# Patient Record
Sex: Female | Born: 2005 | Hispanic: No | Marital: Single | Smoking: Never smoker
Health system: Southern US, Community
[De-identification: ages and names within clinical notes are randomized; demographics above are authoritative.]

## PROBLEM LIST (undated history)

## (undated) DIAGNOSIS — R519 Headache, unspecified: Secondary | ICD-10-CM

## (undated) DIAGNOSIS — C753 Malignant neoplasm of pineal gland: Secondary | ICD-10-CM

## (undated) DIAGNOSIS — Z9889 Other specified postprocedural states: Secondary | ICD-10-CM

## (undated) HISTORY — PX: BRAIN SURGERY: SHX531

## (undated) HISTORY — DX: Headache, unspecified: R51.9

## (undated) HISTORY — PX: PORTA CATH INSERTION: CATH118285

## (undated) HISTORY — PX: PORT-A-CATH REMOVAL: SHX5289

## (undated) HISTORY — DX: Other specified postprocedural states: Z98.890

---
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
## 2024-03-13 ENCOUNTER — Encounter: Payer: Self-pay | Admitting: Family Medicine

## 2024-03-13 ENCOUNTER — Ambulatory Visit: Admitting: Family Medicine

## 2024-03-13 VITALS — BP 114/81 | HR 90 | Ht 61.0 in | Wt 140.1 lb

## 2024-03-13 DIAGNOSIS — E063 Autoimmune thyroiditis: Secondary | ICD-10-CM | POA: Diagnosis not present

## 2024-03-13 DIAGNOSIS — N915 Oligomenorrhea, unspecified: Secondary | ICD-10-CM | POA: Insufficient documentation

## 2024-03-13 DIAGNOSIS — N914 Secondary oligomenorrhea: Secondary | ICD-10-CM | POA: Diagnosis not present

## 2024-03-13 NOTE — Progress Notes (Signed)
   Subjective:    Patient ID: Monica Mata is a 18 y.o. female presenting with Menstrual Problem (Polyp found at [facility] and it was removed. )  on 03/13/2024  HPI: H/o brain cancer 2021, rx'd surg, XRT, chemo. On DepoLupron during chemo. Cycle returned after 1 year. Cycles were irregular. Then became irregular. Sometimes 2x/month, sometimes skips a month. Was regular prior to chemo. Had polyp noted on cervix and removed. Cycles are still irregular. Weight has been stable.  Review of Systems  Constitutional:  Negative for chills and fever.  Respiratory:  Negative for shortness of breath.   Cardiovascular:  Negative for chest pain.  Gastrointestinal:  Negative for abdominal pain, nausea and vomiting.  Genitourinary:  Negative for dysuria.  Skin:  Negative for rash.      Objective:    BP 114/81 (BP Location: Left Arm, Patient Position: Sitting, Cuff Size: Normal)   Pulse 90   Ht 5' 1 (1.549 m)   Wt 140 lb 1.9 oz (63.6 kg)   LMP 01/05/2024 (Exact Date)   BMI 26.48 kg/m  Physical Exam Exam conducted with a chaperone present.  Constitutional:      General: She is not in acute distress.    Appearance: She is well-developed.  HENT:     Head: Normocephalic and atraumatic.  Eyes:     General: No scleral icterus. Cardiovascular:     Rate and Rhythm: Normal rate.  Pulmonary:     Effort: Pulmonary effort is normal.  Abdominal:     Palpations: Abdomen is soft.  Musculoskeletal:     Cervical back: Neck supple.  Skin:    General: Skin is warm and dry.  Neurological:     Mental Status: She is alert and oriented to person, place, and time.         Assessment & Plan:   Problem List Items Addressed This Visit       Unprioritized   Hashimoto's thyroiditis - Primary   Likely a cause of oligomenorrhea. To return to Endocrinology.      Oligomenorrhea   Likely function of thyroid, and post chemo effects. Recent FSH and LH are WNL and show that she is no longer menopausal.  Return of cycles is reassuring. Not sure if this will lead to premature ovarian failure.         No follow-ups on file.  [name], MD 03/13/2024 10:08 AM

## 2024-03-13 NOTE — Assessment & Plan Note (Signed)
 Likely function of thyroid, and post chemo effects. Recent FSH and LH are WNL and show that she is no longer menopausal. Return of cycles is reassuring. Not sure if this will lead to premature ovarian failure.

## 2024-03-13 NOTE — Assessment & Plan Note (Signed)
 Likely a cause of oligomenorrhea. To return to Endocrinology.

## 2024-06-11 ENCOUNTER — Telehealth: Payer: Self-pay

## 2024-06-11 NOTE — Telephone Encounter (Signed)
 Attempted to schedule appointment this morning but had to leave a voicemail for the patient to call the office back.

## 2024-06-11 NOTE — Telephone Encounter (Signed)
-----   Message from [facility] sent at 06/11/2024  8:14 AM EST ----- Regarding: RE: Cervical Polyp What about today with [name] at 10:30am? ----- Message ----- From: Monica Mata Sent: 06/10/2024   3:55 PM EST To: [facility] Clinical Subject: Cervical Polyp                                 Patient called in stating that she has a cervical polyp again and it's extremely painful. Would like removal in office with a female provider. Informed patient that we do not have a female provider the rest of this week who does that procedure.   Informed her that it has to be with a gyn MD and the schedule for next week is completely booked. Informed patient that I am unable to [facility] without approval from a provider. Please advise on the course of action for the patient.

## 2024-07-06 ENCOUNTER — Ambulatory Visit: Admitting: Obstetrics and Gynecology
